# Patient Record
Sex: Female | Born: 1980 | Race: Black or African American | Hispanic: No | Marital: Single
Health system: Southern US, Community
[De-identification: ages and names within clinical notes are randomized; demographics above are authoritative.]

## PROBLEM LIST (undated history)

## (undated) DIAGNOSIS — D649 Anemia, unspecified: Secondary | ICD-10-CM

---
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
## 2000-04-24 ENCOUNTER — Inpatient Hospital Stay (HOSPITAL_COMMUNITY): Admission: AD | Admit: 2000-04-24 | Discharge: 2000-04-24 | Payer: Self-pay | Admitting: *Deleted

## 2000-05-26 ENCOUNTER — Encounter: Payer: Self-pay | Admitting: *Deleted

## 2000-05-26 ENCOUNTER — Inpatient Hospital Stay (HOSPITAL_COMMUNITY): Admission: AD | Admit: 2000-05-26 | Discharge: 2000-05-26 | Payer: Self-pay | Admitting: *Deleted

## 2000-06-10 ENCOUNTER — Inpatient Hospital Stay (HOSPITAL_COMMUNITY): Admission: AD | Admit: 2000-06-10 | Discharge: 2000-06-10 | Payer: Self-pay | Admitting: Obstetrics

## 2000-07-16 ENCOUNTER — Ambulatory Visit (HOSPITAL_COMMUNITY): Admission: RE | Admit: 2000-07-16 | Discharge: 2000-07-16 | Payer: Self-pay | Admitting: *Deleted

## 2000-08-06 ENCOUNTER — Encounter: Admission: RE | Admit: 2000-08-06 | Discharge: 2000-08-06 | Payer: Self-pay | Admitting: Obstetrics

## 2000-08-16 ENCOUNTER — Inpatient Hospital Stay (HOSPITAL_COMMUNITY): Admission: AD | Admit: 2000-08-16 | Discharge: 2000-08-16 | Payer: Self-pay | Admitting: Obstetrics

## 2000-08-20 ENCOUNTER — Encounter: Admission: RE | Admit: 2000-08-20 | Discharge: 2000-08-20 | Payer: Self-pay | Admitting: Obstetrics

## 2000-09-03 ENCOUNTER — Encounter: Admission: RE | Admit: 2000-09-03 | Discharge: 2000-09-03 | Payer: Self-pay | Admitting: Obstetrics

## 2000-09-10 ENCOUNTER — Ambulatory Visit (HOSPITAL_COMMUNITY): Admission: RE | Admit: 2000-09-10 | Discharge: 2000-09-10 | Payer: Self-pay | Admitting: Obstetrics

## 2000-09-16 ENCOUNTER — Encounter: Admission: RE | Admit: 2000-09-16 | Discharge: 2000-09-16 | Payer: Self-pay | Admitting: Obstetrics & Gynecology

## 2000-10-03 ENCOUNTER — Inpatient Hospital Stay (HOSPITAL_COMMUNITY): Admission: AD | Admit: 2000-10-03 | Discharge: 2000-10-05 | Payer: Self-pay | Admitting: Obstetrics

## 2000-10-14 ENCOUNTER — Encounter: Admission: RE | Admit: 2000-10-14 | Discharge: 2000-10-14 | Payer: Self-pay | Admitting: Obstetrics & Gynecology

## 2000-10-28 ENCOUNTER — Ambulatory Visit (HOSPITAL_COMMUNITY): Admission: RE | Admit: 2000-10-28 | Discharge: 2000-10-28 | Payer: Self-pay | Admitting: Obstetrics & Gynecology

## 2000-11-05 ENCOUNTER — Encounter: Admission: RE | Admit: 2000-11-05 | Discharge: 2000-11-05 | Payer: Self-pay | Admitting: Obstetrics

## 2000-11-19 ENCOUNTER — Encounter: Admission: RE | Admit: 2000-11-19 | Discharge: 2000-11-19 | Payer: Self-pay | Admitting: Obstetrics

## 2000-11-30 ENCOUNTER — Encounter (INDEPENDENT_AMBULATORY_CARE_PROVIDER_SITE_OTHER): Payer: Self-pay

## 2000-11-30 ENCOUNTER — Inpatient Hospital Stay (HOSPITAL_COMMUNITY): Admission: AD | Admit: 2000-11-30 | Discharge: 2000-12-04 | Payer: Self-pay | Admitting: Obstetrics

## 2000-12-01 ENCOUNTER — Encounter: Payer: Self-pay | Admitting: Obstetrics

## 2002-01-13 ENCOUNTER — Emergency Department (HOSPITAL_COMMUNITY): Admission: EM | Admit: 2002-01-13 | Discharge: 2002-01-14 | Payer: Self-pay | Admitting: Emergency Medicine

## 2002-03-09 ENCOUNTER — Inpatient Hospital Stay (HOSPITAL_COMMUNITY): Admission: AD | Admit: 2002-03-09 | Discharge: 2002-03-09 | Payer: Self-pay | Admitting: Obstetrics and Gynecology

## 2003-09-27 ENCOUNTER — Ambulatory Visit (HOSPITAL_COMMUNITY): Admission: RE | Admit: 2003-09-27 | Discharge: 2003-09-27 | Payer: Self-pay | Admitting: *Deleted

## 2003-10-12 ENCOUNTER — Encounter: Admission: RE | Admit: 2003-10-12 | Discharge: 2003-10-12 | Payer: Self-pay | Admitting: *Deleted

## 2003-10-15 ENCOUNTER — Inpatient Hospital Stay (HOSPITAL_COMMUNITY): Admission: AD | Admit: 2003-10-15 | Discharge: 2003-10-16 | Payer: Self-pay | Admitting: *Deleted

## 2003-10-19 ENCOUNTER — Encounter: Admission: RE | Admit: 2003-10-19 | Discharge: 2003-10-19 | Payer: Self-pay | Admitting: Family Medicine

## 2003-10-19 ENCOUNTER — Inpatient Hospital Stay (HOSPITAL_COMMUNITY): Admission: AD | Admit: 2003-10-19 | Discharge: 2003-10-21 | Payer: Self-pay | Admitting: *Deleted

## 2003-10-26 ENCOUNTER — Encounter: Admission: RE | Admit: 2003-10-26 | Discharge: 2003-10-26 | Payer: Self-pay | Admitting: *Deleted

## 2003-11-02 ENCOUNTER — Ambulatory Visit (HOSPITAL_COMMUNITY): Admission: RE | Admit: 2003-11-02 | Discharge: 2003-11-02 | Payer: Self-pay | Admitting: *Deleted

## 2003-11-02 ENCOUNTER — Encounter: Admission: RE | Admit: 2003-11-02 | Discharge: 2003-11-02 | Payer: Self-pay | Admitting: *Deleted

## 2003-11-16 ENCOUNTER — Ambulatory Visit (HOSPITAL_COMMUNITY): Admission: RE | Admit: 2003-11-16 | Discharge: 2003-11-16 | Payer: Self-pay | Admitting: Obstetrics and Gynecology

## 2003-12-20 ENCOUNTER — Encounter: Admission: RE | Admit: 2003-12-20 | Discharge: 2003-12-20 | Payer: Self-pay | Admitting: *Deleted

## 2004-01-18 ENCOUNTER — Encounter: Admission: RE | Admit: 2004-01-18 | Discharge: 2004-01-18 | Payer: Self-pay | Admitting: Family Medicine

## 2004-01-19 ENCOUNTER — Encounter: Admission: RE | Admit: 2004-01-19 | Discharge: 2004-01-19 | Payer: Self-pay | Admitting: *Deleted

## 2004-01-24 ENCOUNTER — Encounter: Admission: RE | Admit: 2004-01-24 | Discharge: 2004-01-24 | Payer: Self-pay | Admitting: *Deleted

## 2004-01-25 ENCOUNTER — Encounter: Admission: RE | Admit: 2004-01-25 | Discharge: 2004-01-25 | Payer: Self-pay | Admitting: Obstetrics and Gynecology

## 2004-02-07 ENCOUNTER — Encounter: Admission: RE | Admit: 2004-02-07 | Discharge: 2004-02-07 | Payer: Self-pay | Admitting: *Deleted

## 2004-02-14 ENCOUNTER — Encounter: Admission: RE | Admit: 2004-02-14 | Discharge: 2004-02-14 | Payer: Self-pay | Admitting: *Deleted

## 2004-02-28 ENCOUNTER — Encounter: Admission: RE | Admit: 2004-02-28 | Discharge: 2004-02-28 | Payer: Self-pay | Admitting: *Deleted

## 2004-03-06 ENCOUNTER — Encounter: Admission: RE | Admit: 2004-03-06 | Discharge: 2004-03-06 | Payer: Self-pay | Admitting: *Deleted

## 2004-03-13 ENCOUNTER — Ambulatory Visit: Payer: Self-pay | Admitting: *Deleted

## 2004-03-25 ENCOUNTER — Inpatient Hospital Stay (HOSPITAL_COMMUNITY): Admission: AD | Admit: 2004-03-25 | Discharge: 2004-03-27 | Payer: Self-pay | Admitting: Family Medicine

## 2004-03-30 ENCOUNTER — Inpatient Hospital Stay (HOSPITAL_COMMUNITY): Admission: AD | Admit: 2004-03-30 | Discharge: 2004-03-30 | Payer: Self-pay | Admitting: *Deleted

## 2005-08-24 IMAGING — US US OB FOLLOW-UP
1 series · 13 of 28 positions shown · non-contrast
Comparison: none

CLINICAL DATA: Anatomic exam.

[Series 1: unknown · 0.22mm/px · 13 of 98 slices shown]
[im 4/98]
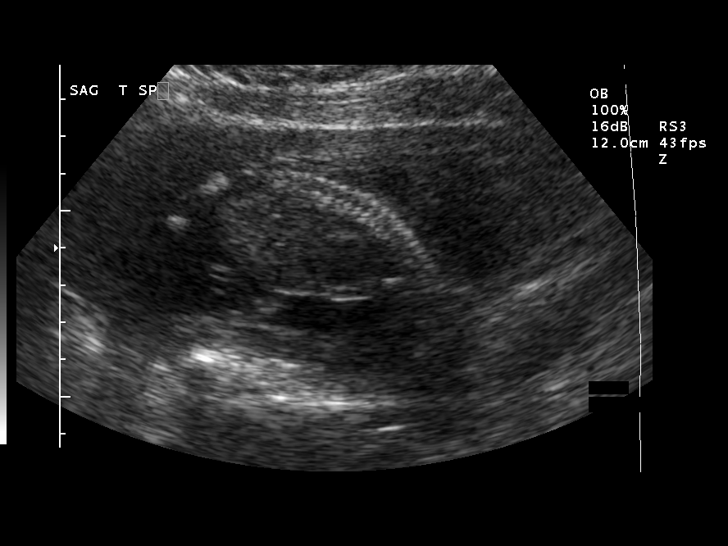
[im 11/98]
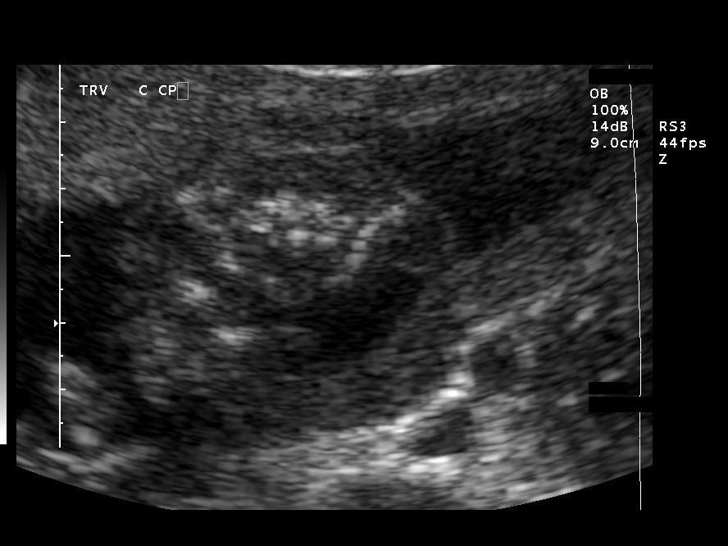
[im 18/98]
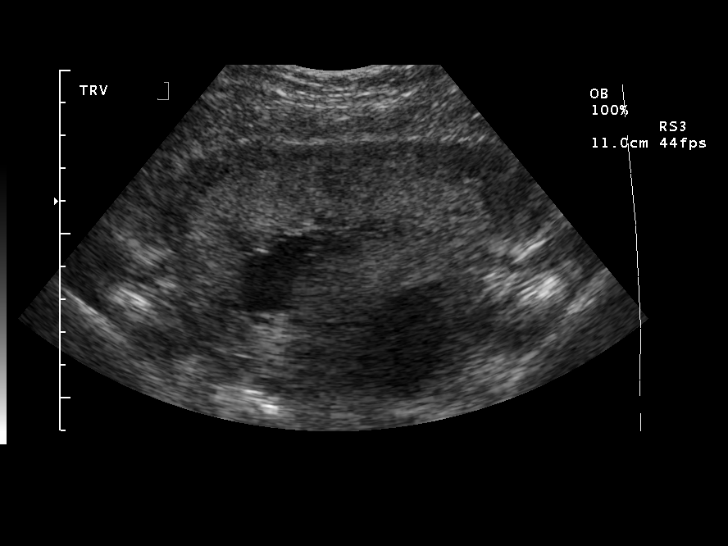
[im 26/98]
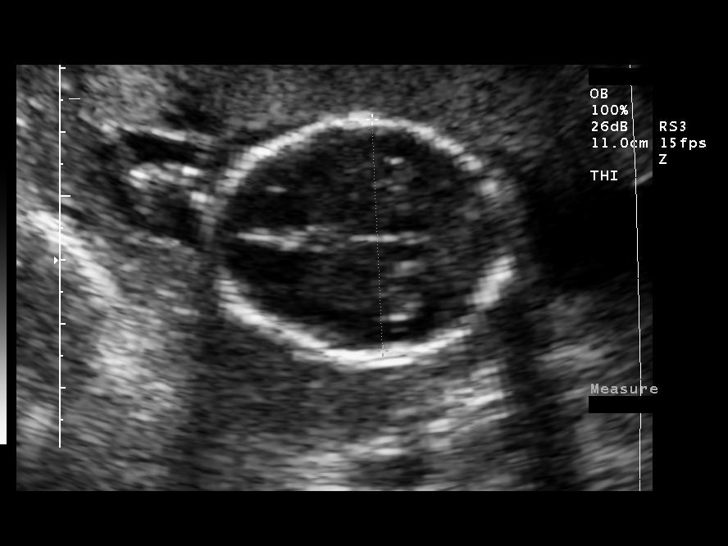
[im 33/98]
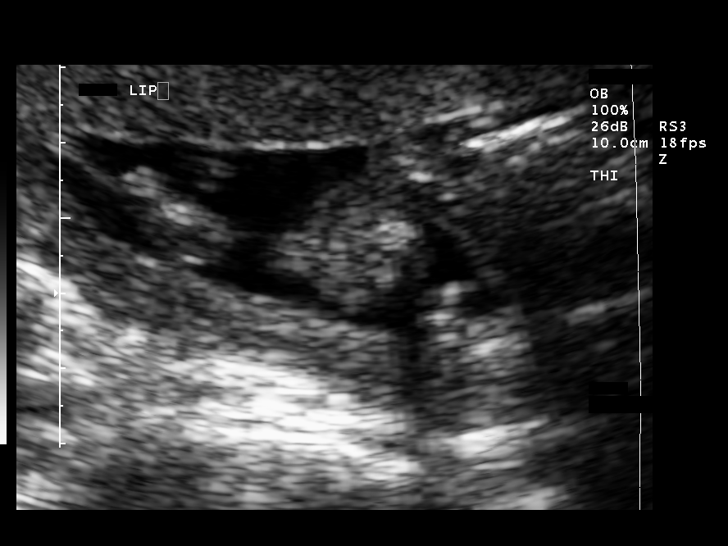
[im 40/98]
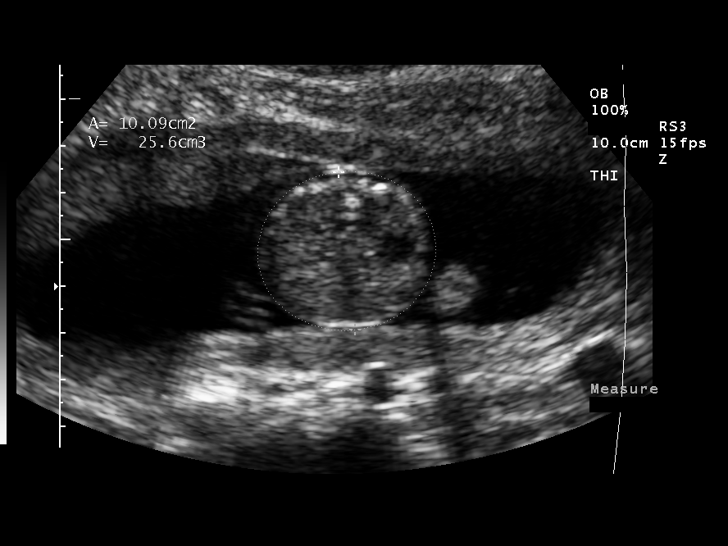
[im 51/98]
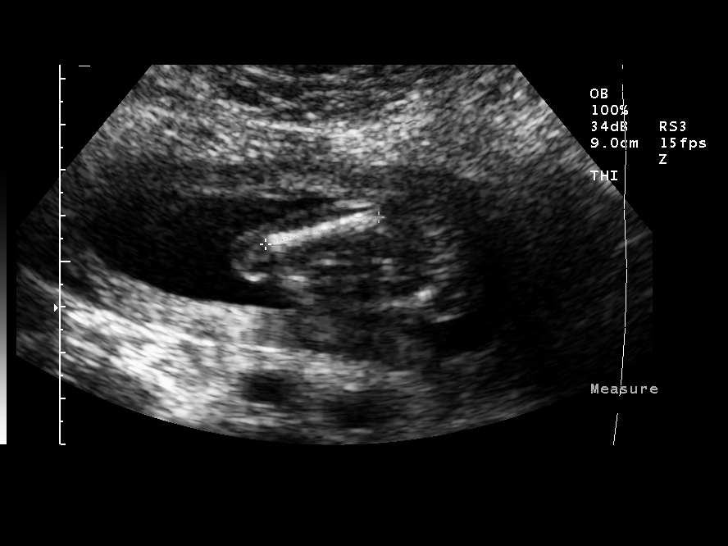
[im 58/98]
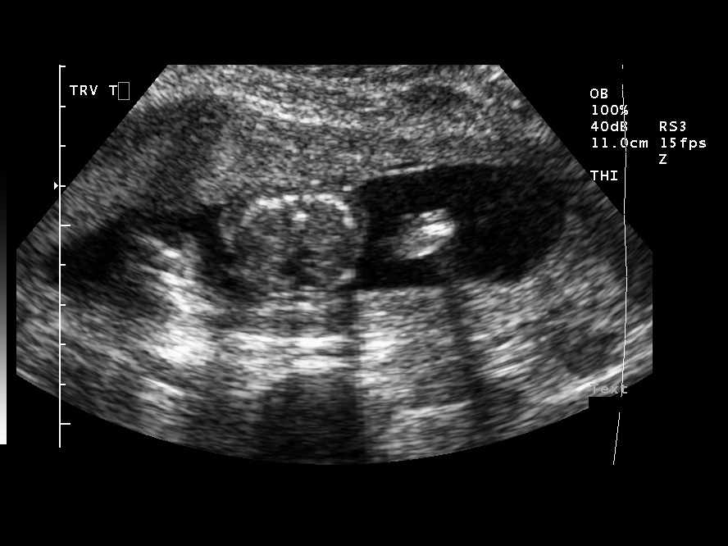
[im 65/98]
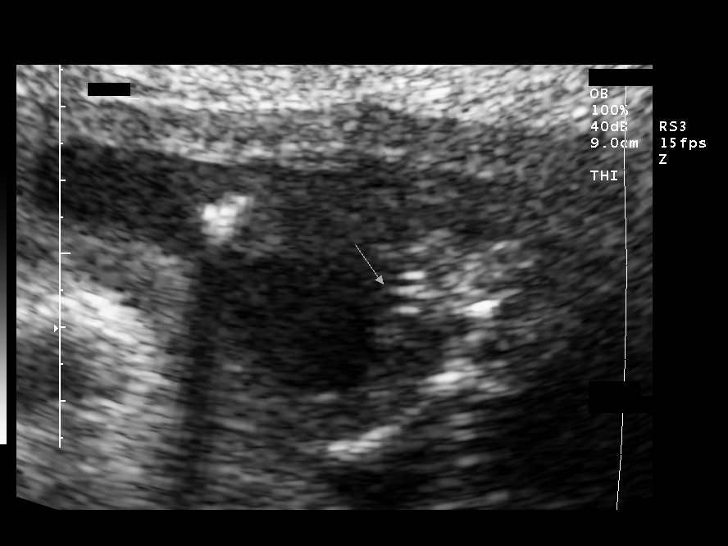
[im 72/98]
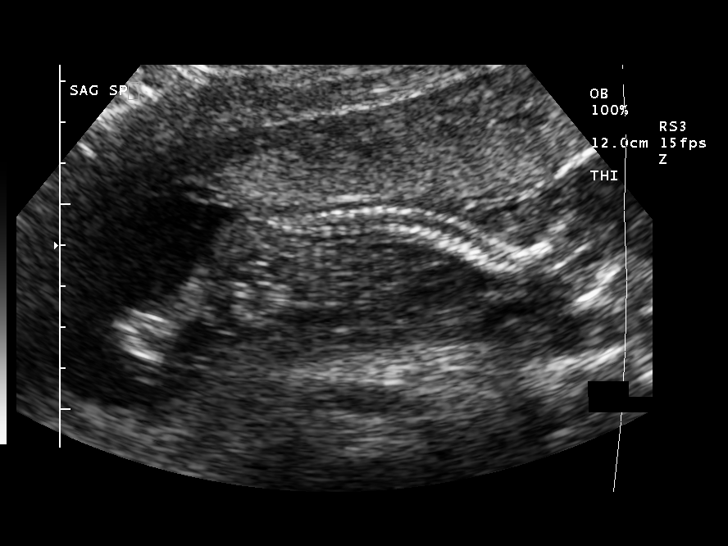
[im 80/98]
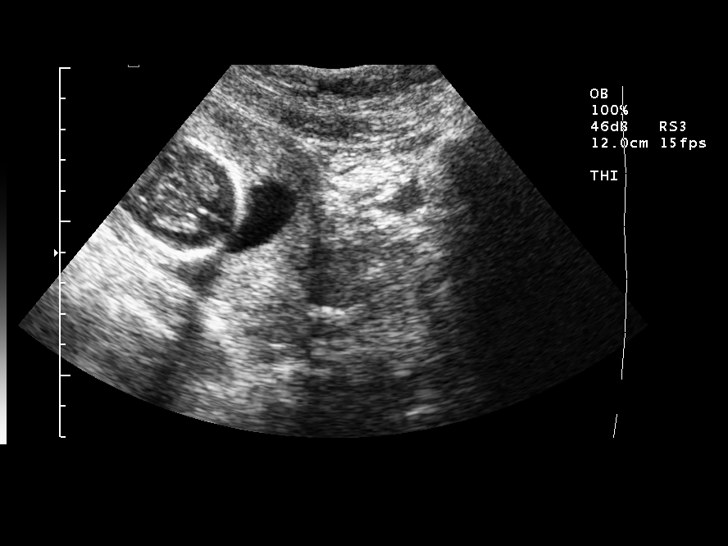
[im 87/98]
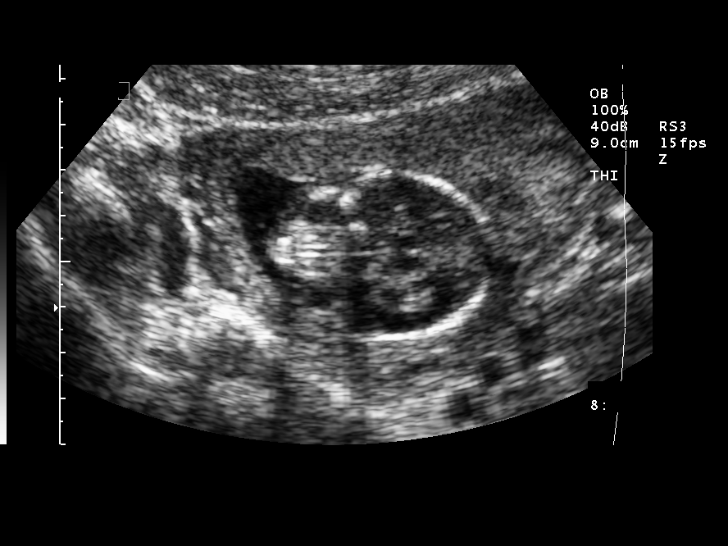
[im 94/98]
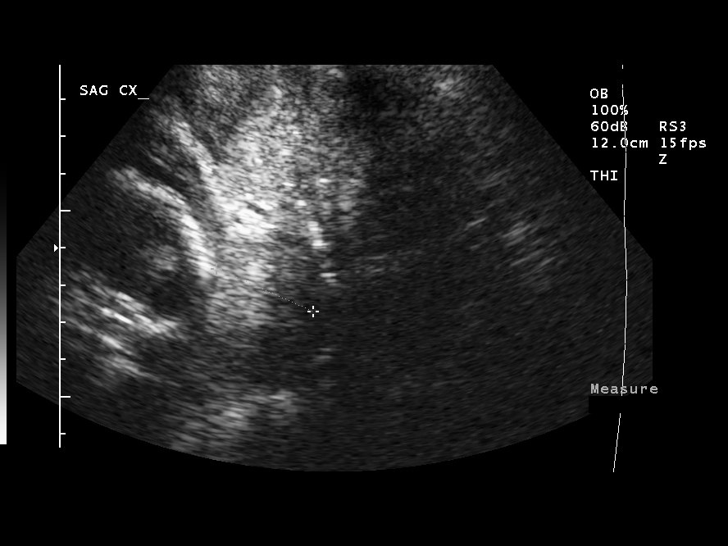

[13 of 28 positions shown; findings below may reference images not displayed]

OBSTETRICAL ULTRASOUND RE-EVALUATION WITH TRANSVAGINAL:
Number of Fetuses:  1
Heart Rate:  145
Movement:  Yes
Breathing:  No
Presentation:  Cephalic
Placental Location:  Anterior
Grade:  I
Previa:  No
Amniotic Fluid (subjective):  Normal
Amniotic Fluid (objective):  3.1 cm Vertical pocket 

FETAL BIOMETRY
BPD:  3.6 cm  17 w 1 d
HC:  14.4 cm  17 w 4 d
AC:  11.5 cm   17 w 2 d
FL:  2.5 cm   17 w 4 d

Mean GA:  17 w 3 d
Assigned GA:  18 w 2 d (LMP)

FETAL ANATOMY
Lateral Ventricles:  Visualized 
Thalami/CSP:  Visualized 
Posterior Fossa:  Visualized 
Nuchal Region:  Visualized 
Spine:  Visualized 
4 Chamber Heart on Left:  Not visualized 
Stomach on Left:  Visualized 
3 Vessel Cord:  Visualized 
Cord Insertion Site:  Visualized 
Kidneys:  Visualized 
Bladder:  Visualized 
Extremities:  Visualized 

ADDITIONAL ANATOMY VISUALIZED:  Upper lip, orbits, diaphragm, ductal arch, aortic arch, and female genitalia

MATERNAL FINDINGS
Cervix: 3.5 cm Transvaginally
IMPRESSION: Single intrauterine pregnancy demonstrating an estimated gestational age by ultrasound of 17 weeks and 3 days.  Correlation with expected estimated gestational age by initial ultrasound of 17 weeks and 5 days, as well as assigned gestational age by LMP of 18 weeks and 2 days suggests appropriate growth.  
No focal fetal or placental abnormalities are noted, however the anatomic visualization of the fetal heart and face, as well as the heel and 5th digits were suboptimal due to positioning on today?s exam.  Follow-up evaluation in 2 weeks would be recommended for hopeful improvement in assessment of these areas.  
The cervix appears long and closed with a length of 3.5 cm transvaginally.

## 2007-05-11 ENCOUNTER — Emergency Department (HOSPITAL_COMMUNITY): Admission: EM | Admit: 2007-05-11 | Discharge: 2007-05-11 | Payer: Self-pay | Admitting: Emergency Medicine

## 2007-08-27 ENCOUNTER — Ambulatory Visit (HOSPITAL_COMMUNITY): Admission: RE | Admit: 2007-08-27 | Discharge: 2007-08-27 | Payer: Self-pay | Admitting: Gynecology

## 2007-08-30 ENCOUNTER — Ambulatory Visit (HOSPITAL_COMMUNITY): Admission: RE | Admit: 2007-08-30 | Discharge: 2007-08-30 | Payer: Self-pay | Admitting: Obstetrics and Gynecology

## 2007-08-30 ENCOUNTER — Encounter: Payer: Self-pay | Admitting: Obstetrics and Gynecology

## 2007-08-30 ENCOUNTER — Ambulatory Visit: Payer: Self-pay | Admitting: Obstetrics and Gynecology

## 2007-09-22 ENCOUNTER — Ambulatory Visit: Payer: Self-pay | Admitting: Obstetrics & Gynecology

## 2007-09-29 ENCOUNTER — Ambulatory Visit: Payer: Self-pay | Admitting: Obstetrics & Gynecology

## 2008-04-20 ENCOUNTER — Inpatient Hospital Stay (HOSPITAL_COMMUNITY): Admission: AD | Admit: 2008-04-20 | Discharge: 2008-04-20 | Payer: Self-pay | Admitting: Obstetrics & Gynecology

## 2008-06-15 ENCOUNTER — Inpatient Hospital Stay (HOSPITAL_COMMUNITY): Admission: AD | Admit: 2008-06-15 | Discharge: 2008-06-15 | Payer: Self-pay | Admitting: Family Medicine

## 2009-11-24 ENCOUNTER — Inpatient Hospital Stay (HOSPITAL_COMMUNITY): Admission: AD | Admit: 2009-11-24 | Discharge: 2009-11-24 | Payer: Self-pay | Admitting: Obstetrics & Gynecology

## 2010-07-28 ENCOUNTER — Encounter: Payer: Self-pay | Admitting: *Deleted

## 2010-09-23 LAB — POCT PREGNANCY, URINE: Preg Test, Ur: NEGATIVE

## 2010-09-23 LAB — WET PREP, GENITAL: Trich, Wet Prep: NONE SEEN

## 2010-11-19 NOTE — Group Therapy Note (Signed)
NAMEJAVIONNA, [name] NO.:  [id number]   MEDICAL RECORD NO.:  [id number]          PATIENT TYPE:  WOC   LOCATION:  [facility]                   FACILITY:  [facility]   PHYSICIAN:  Elsie Lincoln, MD      DATE OF BIRTH:  09-Jan-1981   DATE OF SERVICE:  09/29/2007                                  CLINIC NOTE   Patient is a 30 year old female who had a D&E from missed AB on August 30, 2007 by Dr. Okey Dupre.  She had some spotting for a couple of months, but  nothing now.  She is sexually active and uses condoms for birth control,  is not interested in getting anything else.  I did notice that she has  hirsutism on her chin and she does have occasional irregular periods  sometimes every month, sometimes every other month.  She probably has  polycystic ovarian syndrome and this needs to be worked up at some  point.  She needs to come back for her yearly exam and we can draw  baseline labs then.   PHYSICAL EXAMINATION:  Temperature 97.8, pulse 82, blood pressure  113/74, weight 211.9, height 5 feet 4 inches.  GENERAL:  Well nourished, well developed, in no apparent distress.  HEENT:  Normocephalic, atraumatic with hirsutism on the chin.  GENITALIA:  Tanner 5.  Vagina pink.  Normal rugae.  Cervic closed,  nontender.  Uterus nontender.  Adnexa no masses, nontender.   ASSESSMENT/PLAN:  30 year old female with status post D&E  doing well.  1. Probable PCA workup next visit.  2. Schedule for Pap smear.  3. 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           ______________________________  Elsie Lincoln, MD     KL/MEDQ  D:  09/29/2007  T:  09/29/2007  Job:  [id number]

## 2010-11-19 NOTE — Op Note (Signed)
[name], [name]             ACCOUNT NO.:  [id number]   MEDICAL RECORD NO.:  [id number]          PATIENT TYPE:  AMB   LOCATION:  [facility]                           [facility]   PHYSICIAN:  Phil D. Okey Dupre, M.D.     DATE OF BIRTH:  July 04, 1981   DATE OF PROCEDURE:  08/30/2007  DATE OF DISCHARGE:                               OPERATIVE REPORT   PROCEDURE:  Dilatation evacuation.   PREOPERATIVE DIAGNOSES:  Missed abortion.   POSTOPERATIVE DIAGNOSES:  Missed abortion.   SURGEON:  Dr. Okey Dupre.   ANESTHESIA:  General.   ESTIMATED BLOOD LOSS:  Minimal.   POSTOPERATIVE CONDITION:  Satisfactory.   SPECIMENS TO PATHOLOGY:  Products of conception.   DESCRIPTION OF PROCEDURE:  Under satisfactory general anesthesia with  the patient in the dorsal lithotomy position, the perineum and vagina  prepped and draped in the usual sterile manner. Bimanual pelvic  examination under anesthesia revealed a uterus of about 8-10 week size,  anterior, freely movable with a weighted speculum placed in the  posterior fourchette of the vagina through the marital introitus. BUS  within normal limits.  The vagina was clean and well rugated. The  anterior lip of the cervix was grasped with a single-tooth tenaculum.  The uterine cavity was sounded to a depth of 10 cm. The cervical os  dilated to a #10 Hagar dilator and using a #10 suction curette, the  uterine cavity was evacuated without incident.  Just prior to the  procedure, 10 mL of 1% Xylocaine plain was injected into each of two  areas in the paracervical areas for further patient comfort. The  tenaculum and speculum removed from the vagina.  The patient transferred  to recovery room in satisfactory condition having tolerated the  procedure well.      Phil D. Okey Dupre, M.D.  Electronically Signed     PDR/MEDQ  D:  08/30/2007  T:  08/30/2007  Job:  [id number]

## 2010-11-22 NOTE — Discharge Summary (Signed)
[facility]  Patient:    [name], [name]                      MRN: [id number] Adm. Date:  [id number] Disc. Date: 10/05/00 Attending:  Tammi Sou Dictator:   Santiago Bumpers, M.D.                           Discharge Summary  ADMISSION DIAGNOSES:          1. Pyelonephritis.                               2. A 27 week 3 day intrauterine pregnancy.  DISCHARGE DIAGNOSES:          1. Pyelonephritis.                               2. A 27 week 3 day intrauterine pregnancy.  CONSULTS:                     None.  PROCEDURES:                   None.  HISTORY AND PHYSICAL:         For complete H&P please see resident H&P in chart.  Briefly, this is a 30 year old G2, P0-1-0-0 who presents at 27 weeks 3 days with complaint of back pain and right-sided pain for one day.  No contractions.  No loss of fluid.  No bleeding.  No dysuria.  Prenatal course is significant for being seen at high risk OB clinic with onset of care at 19 weeks.  PAST OBSTETRICAL HISTORY:     Second trimester fetal loss and GBS positive. Has had a previous cesarean section as well.  LABORATORIES:                 Syphilis, HIV, GC, chlamydia, and GBS were all negative on July 13, 2000.  Hepatitis B surface antigen was also negative. Most recent ultrasound was at 22 weeks and that showed anterior placenta, normal AFI, and cervix long and closed.  PHYSICAL EXAMINATION  VITAL SIGNS:                  Patient was afebrile.  Vital signs were stable.  ABDOMEN:                      Positive right CVA tenderness, otherwise normal. Fetal heart rate was 148-158 with average long-term variability with mild variable decelerations.  No contractions noted on tocometry.  LABORATORIES:                 Urinalysis with trace hemoglobin, 15 ketones, 6-10 red blood cells, moderate leukocyte esterase, many bacteria, too numerous to count white blood cells.   Patient was admitted for pyelonephritis and placed on Cefotan.  HOSPITAL COURSE:                               #1 - PYELONEPHRITIS:  Patient was placed on IV Cefotan and gradually showed clinical improvement.  She remained afebrile and had stable vital signs and on the last day of hospitalization was pain-free. Her urine culture showed Proteus greater than 100,000 colonies and was sensitive to ______  and several fluoroquinolones, but was resistant to Macrobid.  She was discharged home on Keflex 500 mg b.i.d. to finish a 14 day course.  She was then instructed to do chronic suppressive therapy with Suprax 400 mg q.d. until delivery.                                #2 - A 27 WEEK 3 DAY INTRAUTERINE PREGNANCY: After admission the patient had periodic check for uterine contractions and check of fetal heart tones.  She did not have any uterine contractions and fetal heart tones remained in the 145-155 range.  DISPOSITION:                  Patient was discharged to home and instructed to take the previously mentioned discharge medications.  She was instructed to keep her appointment with high risk OB clinic the week after discharge and she will have her urine followed up there.  No restrictions on diet or activity were placed. DD:  10/05/00 TD:  10/05/00 Job: [id number]

## 2010-11-22 NOTE — Discharge Summary (Signed)
NAMESHERRI, MCARTHY                         ACCOUNT NO.:  [id number]   MEDICAL RECORD NO.:  [id number]                  PATIENT TYPE:  INP   LOCATION:  9155                                 [facility]   PHYSICIAN:  Phil D. Okey Dupre, M.D.                  DATE OF BIRTH:  March 07, 1981   DATE OF ADMISSION:  10/19/2003  DATE OF DISCHARGE:  10/21/2003                                 DISCHARGE SUMMARY   LABORATORY DATA:  None.   STUDIES:  Ultrasound:  Cervical length 3.1, amniotic fluid pocket 3.2.   HOSPITAL COURSE:  This is a 30 year old G 3, P 0-2-0-1 who presented in the  [facility] with some vaginal bleeding and a history of incompetent  cervix.  She was admitted to the antenatal unit for observation and to  continue her bed rest.  She was also started on Unasyn and an ultrasound was  obtained.  Please see the ultrasound results above.  Over the next two days,  she was watched closely and to see if her cervix would open up and to see  whether she would contract.  Neither occurred during her stay and on day of  discharge her cervical exam was closed, thick and high.  She will be  discharged home on bed rest with no sexual activity.   MEDICATIONS ON DISCHARGE:  None.   FOLLOW UP VISIT:  The patient is to keep her visit in the [facility] Clinic  in four days.     Miles Costain                                Phil D. Okey Dupre, M.D.    DY/MEDQ  D:  10/21/2003  T:  10/21/2003  Job:  [id number]

## 2011-03-28 LAB — ABO/RH: ABO/RH(D): O POS

## 2011-03-28 LAB — CBC
HCT: 35.3 — ABNORMAL LOW
MCV: 89.2
Platelets: 265
RBC: 3.97

## 2011-04-07 LAB — WET PREP, GENITAL
Clue Cells Wet Prep HPF POC: NONE SEEN
Trich, Wet Prep: NONE SEEN
Yeast Wet Prep HPF POC: NONE SEEN

## 2011-04-07 LAB — URINALYSIS, ROUTINE W REFLEX MICROSCOPIC
Bilirubin Urine: NEGATIVE
Ketones, ur: NEGATIVE
Protein, ur: NEGATIVE
Specific Gravity, Urine: 1.02

## 2011-04-07 LAB — POCT PREGNANCY, URINE: Preg Test, Ur: NEGATIVE

## 2011-04-07 LAB — GC/CHLAMYDIA PROBE AMP, GENITAL: Chlamydia, DNA Probe: NEGATIVE

## 2011-04-10 LAB — WET PREP, GENITAL

## 2011-04-10 LAB — GC/CHLAMYDIA PROBE AMP, GENITAL: GC Probe Amp, Genital: NEGATIVE

## 2019-01-10 ENCOUNTER — Telehealth: Payer: Self-pay | Admitting: Family

## 2019-01-10 DIAGNOSIS — R519 Headache, unspecified: Secondary | ICD-10-CM

## 2019-01-10 DIAGNOSIS — M546 Pain in thoracic spine: Secondary | ICD-10-CM
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
## 2021-02-27 ENCOUNTER — Other Ambulatory Visit: Payer: Self-pay | Admitting: Family Medicine

## 2021-02-28 ENCOUNTER — Other Ambulatory Visit: Payer: Self-pay | Admitting: Family Medicine

## 2021-02-28 DIAGNOSIS — Z1231 Encounter for screening mammogram for malignant neoplasm of breast: Secondary | ICD-10-CM

## 2021-03-02 ENCOUNTER — Ambulatory Visit: Payer: Self-pay

## 2021-03-28 ENCOUNTER — Other Ambulatory Visit: Payer: Self-pay

## 2021-03-28 ENCOUNTER — Ambulatory Visit
Admission: RE | Admit: 2021-03-28 | Discharge: 2021-03-28 | Disposition: A | Discharge status: Home / Self Care | Payer: No Typology Code available for payment source | Source: Ambulatory Visit | Attending: Family Medicine | Admitting: Family Medicine

## 2021-03-28 DIAGNOSIS — Z1231 Encounter for screening mammogram for malignant neoplasm of breast: Secondary | ICD-10-CM

## 2023-01-18 IMAGING — MG MM DIGITAL SCREENING BILAT W/ TOMO AND CAD
6 of 10 series · 6 of 30 positions shown · non-contrast
Comparison: None.

CLINICAL DATA: Screening.

EXAM:
DIGITAL SCREENING BILATERAL MAMMOGRAM WITH TOMOSYNTHESIS AND CAD
TECHNIQUE: Bilateral screening digital craniocaudal and mediolateral oblique
mammograms were obtained. Bilateral screening digital breast
tomosynthesis was performed. The images were evaluated with
computer-aided detection.

[L CC synth-2D (1 of 2)]
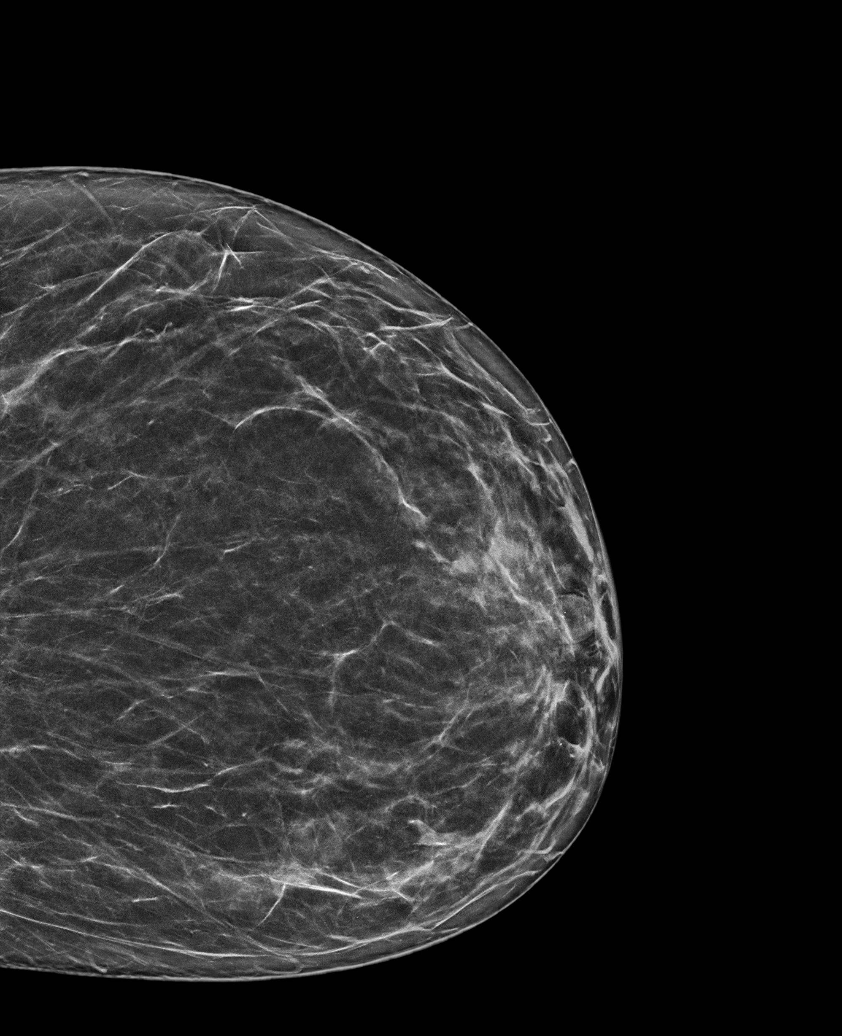

[L CC synth-2D (2 of 2)]
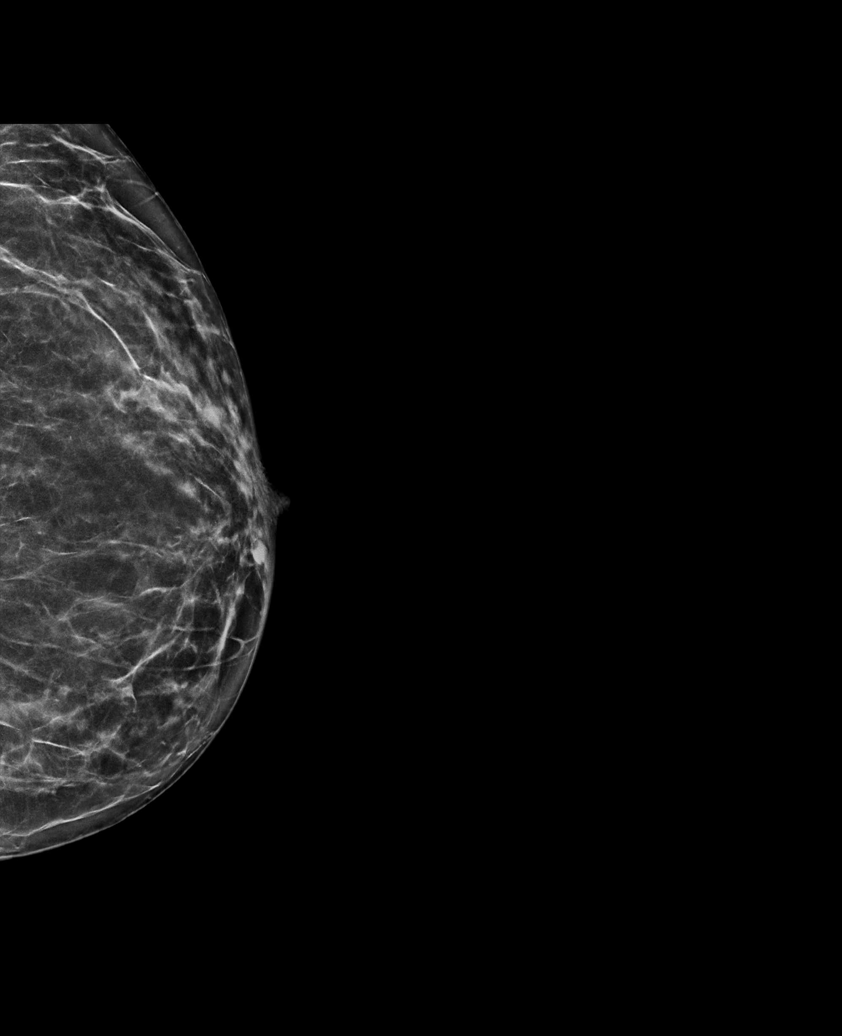

[R CC synth-2D]
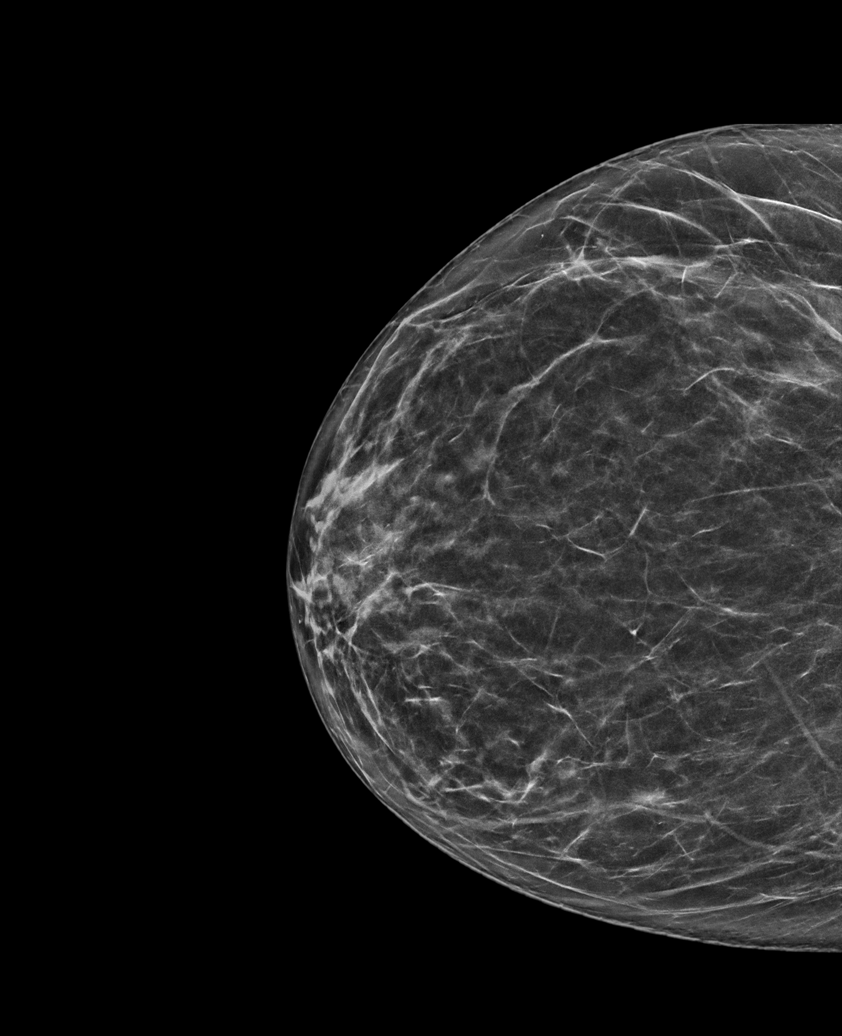

[L MLO synth-2D]
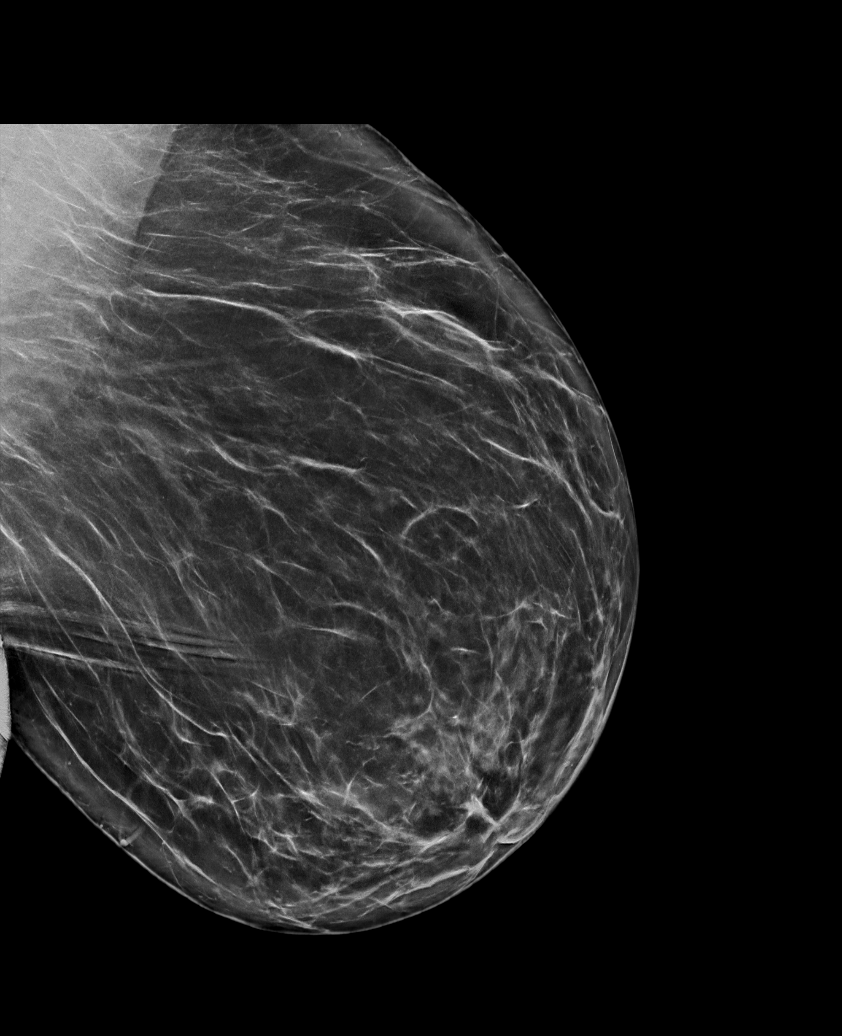

[R MLO synth-2D]
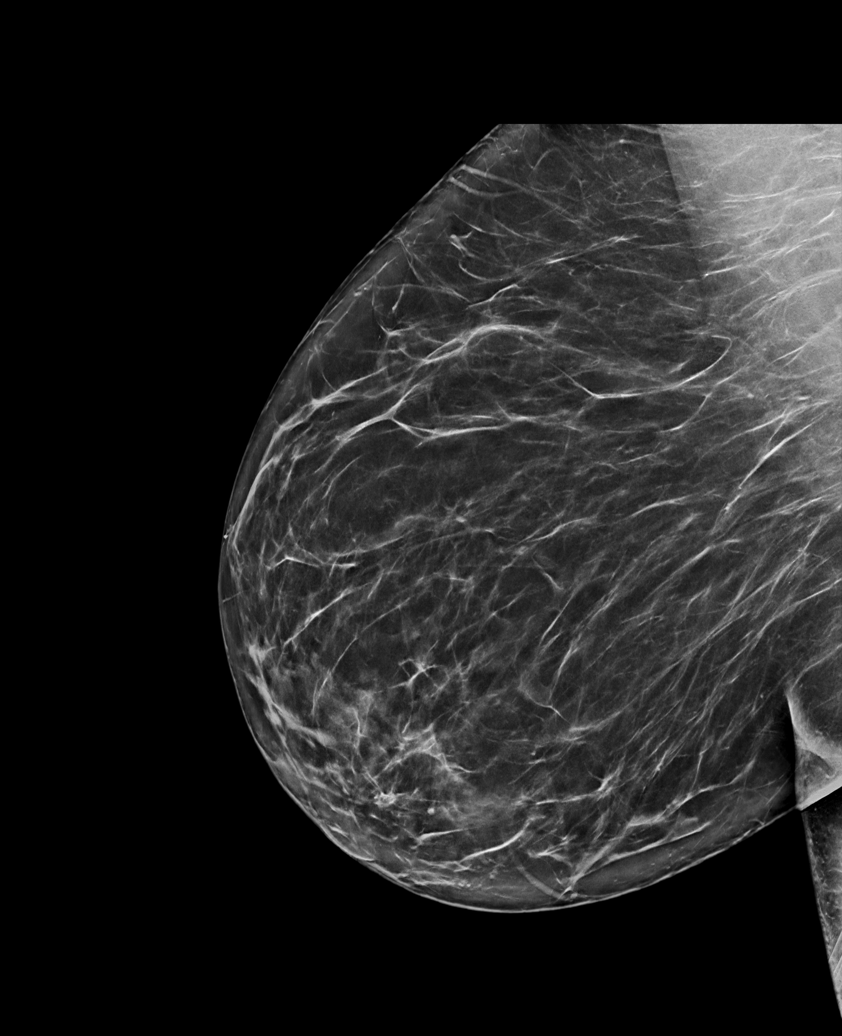

[R MLO tomo · tomo slice 43/85.0]
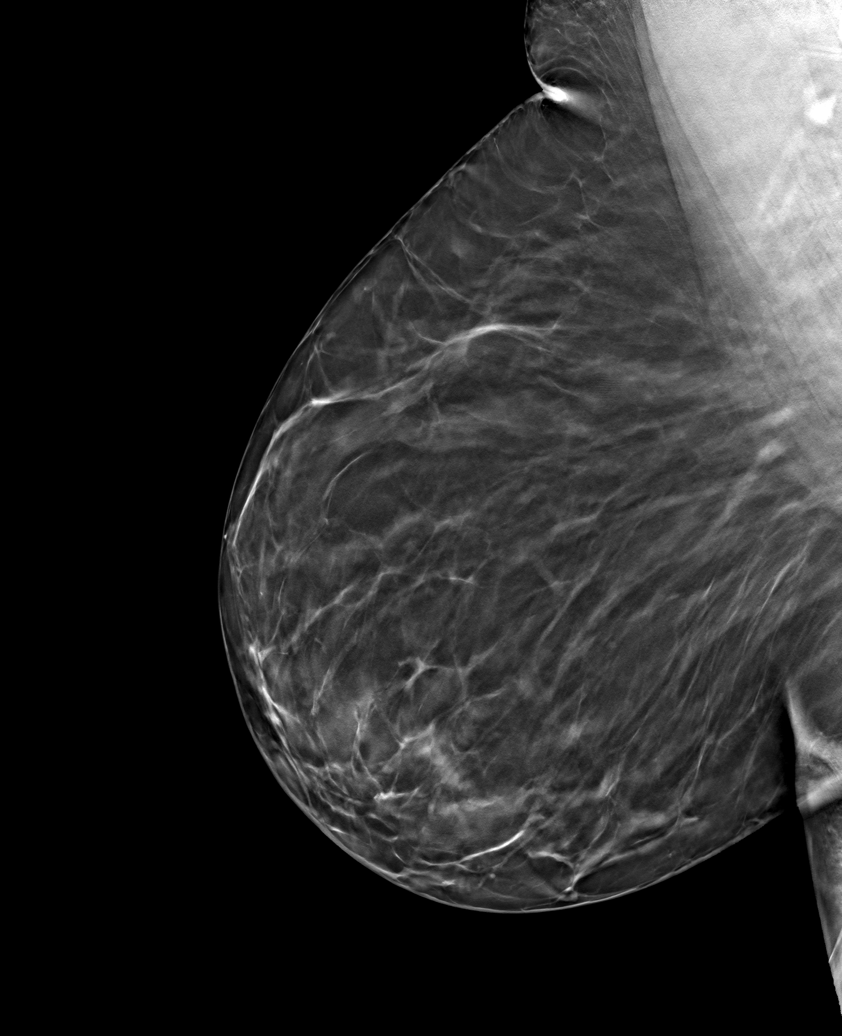

[6 of 30 positions shown; findings below may reference images not displayed]

ACR Breast Density Category c: The breast tissue is heterogeneously
dense, which may obscure small masses
FINDINGS: There are no findings suspicious for malignancy.
IMPRESSION: No mammographic evidence of malignancy. A result letter of this
screening mammogram will be mailed directly to the patient.

RECOMMENDATION:
Screening mammogram in one year. (Code:C8-T-HNK)

BI-RADS CATEGORY  1: Negative.

## 2023-10-15 ENCOUNTER — Emergency Department (HOSPITAL_COMMUNITY): Payer: PRIVATE HEALTH INSURANCE

## 2023-10-15 ENCOUNTER — Other Ambulatory Visit: Payer: Self-pay

## 2023-10-15 ENCOUNTER — Encounter (HOSPITAL_COMMUNITY): Payer: Self-pay | Admitting: *Deleted

## 2023-10-15 ENCOUNTER — Emergency Department (HOSPITAL_COMMUNITY)
Admission: EM | Admit: 2023-10-15 | Discharge: 2023-10-15 | Disposition: A | Discharge status: Home / Self Care | Payer: PRIVATE HEALTH INSURANCE | Attending: Emergency Medicine | Admitting: Emergency Medicine

## 2023-10-15 DIAGNOSIS — M542 Cervicalgia: Secondary | ICD-10-CM | POA: Diagnosis not present

## 2023-10-15 DIAGNOSIS — M25512 Pain in left shoulder: Secondary | ICD-10-CM | POA: Diagnosis not present

## 2023-10-15 DIAGNOSIS — R519 Headache, unspecified: Secondary | ICD-10-CM | POA: Insufficient documentation

## 2023-10-15 DIAGNOSIS — Y92410 Unspecified street and highway as the place of occurrence of the external cause: Secondary | ICD-10-CM | POA: Insufficient documentation

## 2023-10-15 DIAGNOSIS — M79602 Pain in left arm: Secondary | ICD-10-CM | POA: Diagnosis not present

## 2023-10-15 DIAGNOSIS — M7918 Myalgia, other site: Secondary | ICD-10-CM

## 2023-10-15 DIAGNOSIS — M545 Low back pain, unspecified: Secondary | ICD-10-CM | POA: Insufficient documentation

## 2023-10-15 HISTORY — DX: Anemia, unspecified: D64.9

## 2023-10-15 LAB — PREGNANCY, URINE: Preg Test, Ur: NEGATIVE

## 2023-10-15 MED ORDER — LIDOCAINE 5 % EX PTCH: 1.0000 | MEDICATED_PATCH | CUTANEOUS | 0 refills | Status: AC

## 2023-10-15 MED ORDER — ONDANSETRON 4 MG PO TBDP
4.0000 mg | ORAL_TABLET | Freq: Once | ORAL | Status: AC
  Administered 2023-10-15: 4 mg via ORAL
  Filled 2023-10-15: qty 1

## 2023-10-15 MED ORDER — ACETAMINOPHEN 325 MG PO TABS
650.0000 mg | ORAL_TABLET | Freq: Once | ORAL | Status: AC
  Administered 2023-10-15: 650 mg via ORAL
  Filled 2023-10-15: qty 2

## 2023-10-15 MED ORDER — METAXALONE 800 MG PO TABS: 800.0000 mg | ORAL_TABLET | Freq: Three times a day (TID) | ORAL | 0 refills | Status: AC

## 2023-10-15 MED ORDER — KETOROLAC TROMETHAMINE 15 MG/ML IJ SOLN
15.0000 mg | Freq: Once | INTRAMUSCULAR | Status: AC
Start: 2023-10-15 — End: 2023-10-15
  Administered 2023-10-15: 15 mg via INTRAMUSCULAR
  Filled 2023-10-15: qty 1
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
## 2023-10-15 NOTE — ED Notes (Signed)
 Patient discharged by this RN. Patient verbalizes understanding of instructions with no additional questions. Ambulatory to lobby.
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
## 2023-10-15 NOTE — ED Notes (Signed)
 Patient transported to X-ray

## 2023-10-26 ENCOUNTER — Other Ambulatory Visit: Payer: Self-pay | Admitting: Family Medicine

## 2023-10-26 DIAGNOSIS — Z1231 Encounter for screening mammogram for malignant neoplasm of breast: Secondary | ICD-10-CM
